# Patient Record
Sex: Male | Born: 1970 | Hispanic: Yes | Marital: Married | Smoking: Never smoker
Health system: Southern US, Community
[De-identification: ages and names within clinical notes are randomized; demographics above are authoritative.]

## PROBLEM LIST (undated history)

## (undated) DIAGNOSIS — I10 Essential (primary) hypertension: Secondary | ICD-10-CM

## (undated) HISTORY — PX: APPENDECTOMY: SHX54

---

## 2019-08-30 ENCOUNTER — Other Ambulatory Visit: Payer: Self-pay

## 2019-08-30 ENCOUNTER — Emergency Department (INDEPENDENT_AMBULATORY_CARE_PROVIDER_SITE_OTHER): Payer: Self-pay

## 2019-08-30 ENCOUNTER — Emergency Department (INDEPENDENT_AMBULATORY_CARE_PROVIDER_SITE_OTHER)
Admission: EM | Admit: 2019-08-30 | Discharge: 2019-08-30 | Disposition: A | Discharge status: Home / Self Care | Payer: Self-pay | Source: Home / Self Care

## 2019-08-30 ENCOUNTER — Encounter: Payer: Self-pay | Admitting: Family Medicine

## 2019-08-30 DIAGNOSIS — R0789 Other chest pain: Secondary | ICD-10-CM

## 2019-08-30 DIAGNOSIS — S46812A Strain of other muscles, fascia and tendons at shoulder and upper arm level, left arm, initial encounter: Secondary | ICD-10-CM

## 2019-08-30 HISTORY — DX: Essential (primary) hypertension: I10

## 2019-08-30 MED ORDER — DICLOFENAC SODIUM 75 MG PO TBEC: 75.0000 mg | DELAYED_RELEASE_TABLET | Freq: Two times a day (BID) | ORAL | 0 refills | Status: AC

## 2019-08-30 MED ORDER — IBUPROFEN 600 MG PO TABS
600.0000 mg | ORAL_TABLET | Freq: Once | ORAL | Status: AC
  Administered 2019-08-30: 600 mg via ORAL

## 2019-08-30 MED ORDER — CYCLOBENZAPRINE HCL 5 MG PO TABS: 5.0000 mg | ORAL_TABLET | Freq: Every day | ORAL | 0 refills | Status: AC
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
